# Patient Record
Sex: Male | Born: 1995 | Race: Black or African American | Hispanic: No | Marital: Single | Smoking: Never smoker
Health system: Southern US, Community
[De-identification: ages and names within clinical notes are randomized; demographics above are authoritative.]

---

## 2017-08-04 ENCOUNTER — Emergency Department (HOSPITAL_COMMUNITY): Payer: Self-pay

## 2017-08-04 ENCOUNTER — Emergency Department (HOSPITAL_COMMUNITY)
Admission: EM | Admit: 2017-08-04 | Discharge: 2017-08-04 | Disposition: A | Discharge status: Home / Self Care | Payer: Self-pay | Attending: Emergency Medicine | Admitting: Emergency Medicine

## 2017-08-04 ENCOUNTER — Other Ambulatory Visit: Payer: Self-pay

## 2017-08-04 ENCOUNTER — Encounter (HOSPITAL_COMMUNITY): Payer: Self-pay

## 2017-08-04 DIAGNOSIS — Y9389 Activity, other specified: Secondary | ICD-10-CM | POA: Insufficient documentation

## 2017-08-04 DIAGNOSIS — Y999 Unspecified external cause status: Secondary | ICD-10-CM | POA: Insufficient documentation

## 2017-08-04 DIAGNOSIS — S63501A Unspecified sprain of right wrist, initial encounter: Secondary | ICD-10-CM | POA: Insufficient documentation

## 2017-08-04 DIAGNOSIS — W2209XA Striking against other stationary object, initial encounter: Secondary | ICD-10-CM | POA: Insufficient documentation

## 2017-08-04 DIAGNOSIS — Y929 Unspecified place or not applicable: Secondary | ICD-10-CM | POA: Insufficient documentation

## 2017-08-04 NOTE — ED Provider Notes (Signed)
[facility] EMERGENCY DEPARTMENT Provider Note   CSN: [id number] Arrival date & time: 08/04/17  1709     History   Chief Complaint No chief complaint on file.   HPI Dillon Collier is a 22 y.o. male who presents to the ED with wrist pain. Patient reports punching something yesterday causing the pain. Patient denies any other problems at this time.    HPI  History reviewed. No pertinent past medical history.  There are no active problems to display for this patient.   History reviewed. No pertinent surgical history.     Home Medications    Prior to Admission medications   Not on File    Family History No family history on file.  Social History Social History   Tobacco Use  . Smoking status: Not on file  Substance Use Topics  . Alcohol use: Not on file  . Drug use: Not on file     Allergies   Patient has no known allergies.   Review of Systems Review of Systems  Musculoskeletal: Positive for arthralgias.       Right wrist pain.     Physical Exam Updated Vital Signs BP 118/75   Pulse 72   Temp 98.3 F (36.8 C) (Oral)   Resp 18   SpO2 99%   Physical Exam  Constitutional: He appears well-developed and well-nourished. No distress.  HENT:  Head: Normocephalic.  Eyes: EOM are normal.  Neck: Normal range of motion. Neck supple.  Cardiovascular: Normal rate.  Pulmonary/Chest: Effort normal.  Musculoskeletal:       Right wrist: He exhibits tenderness. He exhibits normal range of motion, no swelling, no crepitus and no deformity.       Right hand: He exhibits normal range of motion, no tenderness, normal capillary refill, no deformity and no swelling. Normal sensation noted. Normal strength noted. He exhibits no thumb/finger opposition.  Right wrist with discomfort when bending it forward. Radial pulses 2+, adequate circulation.   Neurological: He is alert.  Skin: Skin is warm and dry.  Psychiatric: He has a normal mood and  affect.  Nursing note and vitals reviewed.    ED Treatments / Results  Labs (all labs ordered are listed, but only abnormal results are displayed) Labs Reviewed - No data to display  Radiology Dg Wrist Complete Right  Result Date: 08/04/2017 CLINICAL DATA:  Painful right wrist.  Punched wall. EXAM: RIGHT WRIST - COMPLETE 3+ VIEW COMPARISON:  None. FINDINGS: There is no evidence of fracture or dislocation. There is no evidence of arthropathy or other focal bone abnormality. Soft tissues are unremarkable. IMPRESSION: No acute osseous abnormality. Electronically Signed   By: Tollie Eth M.D.   On: 08/04/2017 17:53    Procedures Procedures (including critical care time)  Medications Ordered in ED Medications - No data to display   Initial Impression / Assessment and Plan / ED Course  I have reviewed the triage vital signs and the nursing notes. SUBJECTIVE: Greyson Peavy Bjorklund is a 22 y.o. male who sustained a right wrist injury yesterday. Mechanism of injury: was from hitting a wall. Immediate symptoms: included pain and swelling. Symptoms have continued since that time. No prior problems with this area in the past.  OBJECTIVE: Vital signs as noted above. Appearance: alert, well appearing, and in no distress Wrist exam:tender to the palmar aspect of the right wrist. X-ray: negative for fracture or dislocation.  ASSESSMENT: Right wrist sprain PLAN: rest the injured area as much as  practical, apply ice packs, elevate the injured limb, splint dispensed and applied by RN Patient stable for d/c without focal neuro deficits. Return precautions discussed.    Final Clinical Impressions(s) / ED Diagnoses   Final diagnoses:  Right wrist sprain, initial encounter    ED Discharge Orders    None       Kerrie Buffaloeese, Hope ElsieM, [place]NP 08/04/17 Nolen [id number]    Shaune PollackIsaacs, Cameron, MD 08/05/17 [phone]

## 2017-08-04 NOTE — Progress Notes (Signed)
Orthopedic Tech Progress Note Patient Details:  Hiram ComberShymez Daquan Bamba 10/01/1995 [id number]  Ortho Devices Type of Ortho Device: Velcro wrist forearm splint Ortho Device/Splint Location: RUE Ortho Device/Splint Interventions: Ordered, Application   Post Interventions Patient Tolerated: Well Instructions Provided: Care of device   Jennye MoccasinHughes, Zen Cedillos Craig 08/04/2017, 8:15 PM

## 2017-08-04 NOTE — Discharge Instructions (Signed)
Take advil as needed for pain. Return as needed for worsening symptoms.

## 2017-08-04 NOTE — ED Notes (Signed)
Ortho tech paged  

## 2017-08-04 NOTE — ED Triage Notes (Signed)
Patient complains of right wrist pain with any ROM after punching something yesterday, no hand pain, no deformity

## 2017-08-09 ENCOUNTER — Emergency Department (HOSPITAL_COMMUNITY): Payer: Self-pay

## 2017-08-09 ENCOUNTER — Encounter (HOSPITAL_COMMUNITY): Payer: Self-pay

## 2017-08-09 ENCOUNTER — Emergency Department (HOSPITAL_COMMUNITY)
Admission: EM | Admit: 2017-08-09 | Discharge: 2017-08-09 | Disposition: A | Discharge status: Home / Self Care | Payer: Self-pay | Attending: Emergency Medicine | Admitting: Emergency Medicine

## 2017-08-09 ENCOUNTER — Other Ambulatory Visit: Payer: Self-pay

## 2017-08-09 DIAGNOSIS — W2203XA Walked into furniture, initial encounter: Secondary | ICD-10-CM | POA: Insufficient documentation

## 2017-08-09 DIAGNOSIS — S93509A Unspecified sprain of unspecified toe(s), initial encounter: Secondary | ICD-10-CM

## 2017-08-09 DIAGNOSIS — Y93E1 Activity, personal bathing and showering: Secondary | ICD-10-CM | POA: Insufficient documentation

## 2017-08-09 DIAGNOSIS — Y92018 Other place in single-family (private) house as the place of occurrence of the external cause: Secondary | ICD-10-CM | POA: Insufficient documentation

## 2017-08-09 DIAGNOSIS — S93504A Unspecified sprain of right lesser toe(s), initial encounter: Secondary | ICD-10-CM | POA: Insufficient documentation

## 2017-08-09 DIAGNOSIS — Y998 Other external cause status: Secondary | ICD-10-CM | POA: Insufficient documentation

## 2017-08-09 MED ORDER — IBUPROFEN 600 MG PO TABS: 600.0000 mg | ORAL_TABLET | Freq: Four times a day (QID) | ORAL | 0 refills | Status: AC | PRN

## 2017-08-09 MED ORDER — IBUPROFEN 400 MG PO TABS
600.0000 mg | ORAL_TABLET | Freq: Once | ORAL | Status: AC
  Administered 2017-08-09: 600 mg via ORAL
  Filled 2017-08-09: qty 1

## 2017-08-09 NOTE — ED Provider Notes (Signed)
Dillon Collier [facility] EMERGENCY DEPARTMENT Provider Note   CSN: [id number] Arrival date & time: 08/09/17  1430     History   Chief Complaint Chief Complaint  Patient presents with  . Toe Injury    HPI Dillon Collier is a 22 y.o. male.  HPI   Patient is a 22 year old male with no significant past medical history presenting for a right fourth toe injury.  Patient reports he got caught on a chair while getting out of the shower today and jammed it.  Patient reports he was unable to walk on it subsequently.  Patient reports that any flexion of the toe produces intense pain.  Patient reports some bruising to the medial aspect of the right fourth digit.  Patient denies numbness of the affected toe.  History reviewed. No pertinent past medical history.  There are no active problems to display for this patient.   History reviewed. No pertinent surgical history.     Home Medications    Prior to Admission medications   Medication Sig Start Date End Date Taking? Authorizing Provider  ibuprofen (ADVIL,MOTRIN) 600 MG tablet Take 1 tablet (600 mg total) by mouth every 6 (six) hours as needed. 08/09/17   Elisha Ponder, PA-C    Family History History reviewed. No pertinent family history.  Social History Social History   Tobacco Use  . Smoking status: Never Smoker  Substance Use Topics  . Alcohol use: No    Frequency: Never  . Drug use: No     Allergies   Patient has no known allergies.   Review of Systems Review of Systems  Musculoskeletal: Positive for arthralgias and joint swelling.  Skin: Positive for color change. Negative for wound.  Neurological: Negative for weakness and numbness.     Physical Exam Updated Vital Signs BP 130/84 (BP Location: Right Arm)   Pulse 60   Temp 98.2 F (36.8 C) (Oral)   Resp 16   SpO2 100%   Physical Exam  Constitutional: He appears well-developed and well-nourished. No distress.  Sitting comfortably in bed.   HENT:  Head: Normocephalic and atraumatic.  Eyes: Conjunctivae are normal. Right eye exhibits no discharge. Left eye exhibits no discharge.  EOMs normal to gross examination.  Neck: Normal range of motion.  Cardiovascular: Normal rate and regular rhythm.  Intact, 2+ DP pulse of right lower extremity.  Pulmonary/Chest:  Normal respiratory effort. Patient converses comfortably. No audible wheeze or stridor.  Abdominal: He exhibits no distension.  Musculoskeletal: Normal range of motion.  There is mild amount of ecchymosis to the medial aspect of the right fourth toe.  Minimal swelling.  Patient exhibits pain with passive range of motion of this toe.  Patient has decreased range of motion of flexion and extension of toes due to pain.  Full sensation to light touch intact to distal tip of all toes.  Compartments soft of the right lower extremity, and capillary refill less than 2 seconds.  Neurological: He is alert.  Cranial nerves intact to gross observation. Patient moves extremities without difficulty.  Skin: Skin is warm and dry. He is not diaphoretic.  Psychiatric: He has a normal mood and affect. His behavior is normal. Judgment and thought content normal.  Nursing note and vitals reviewed.    ED Treatments / Results  Labs (all labs ordered are listed, but only abnormal results are displayed) Labs Reviewed - No data to display  EKG  EKG Interpretation None       Radiology  Dg Foot Complete Right  Result Date: 08/09/2017 CLINICAL DATA:  Pain after hitting foot against chair EXAM: RIGHT FOOT COMPLETE - 3+ VIEW COMPARISON:  None. FINDINGS: Frontal, oblique, and lateral views were obtained. There is no fracture or dislocation. Joint spaces appear normal. No erosive change. IMPRESSION: No fracture or dislocation.  No evident arthropathy. Electronically Signed   By: Bretta BangWilliam  Woodruff III M.D.   On: 08/09/2017 15:03    Procedures Procedures (including critical care  time)  Medications Ordered in ED Medications  ibuprofen (ADVIL,MOTRIN) tablet 600 mg (600 mg Oral Given 08/09/17 1637)     Initial Impression / Assessment and Plan / ED Course  I have reviewed the triage vital signs and the nursing notes.  Pertinent labs & imaging results that were available during my care of the patient were reviewed by me and considered in my medical decision making (see chart for details).     Patient is well-appearing and in no acute distress.  Pain adequately controlled with ibuprofen.  X-ray, reviewed by me, is negative for any occult fracture.  Suspect sprain of the toe.  Will buddy tape toes, and instruct patient to perform range of motion of these as tolerated.  I instructed patient that he should minimize buddy taping in order to rehabilitate the toe.  Return precautions were given for any numbness, pallor, or increased swelling.  Patient is in understanding and agrees with the plan of care.  Final Clinical Impressions(s) / ED Diagnoses   Final diagnoses:  Sprain of toe, initial encounter    ED Discharge Orders        Ordered    ibuprofen (ADVIL,MOTRIN) 600 MG tablet  Every 6 hours PRN     08/09/17 1611       Elisha PonderMurray, Vidur Knust B, PA-C 08/09/17 1736    Arby BarrettePfeiffer, Marcy, MD 08/11/17 [phone]

## 2017-08-09 NOTE — ED Notes (Signed)
Patient transported to X-ray 

## 2017-08-09 NOTE — Discharge Instructions (Signed)
Please see the information and instructions below regarding your visit.  Your diagnoses today include:  1. Sprain of toe, initial encounter     Tests performed today include: See side panel of your discharge paperwork for testing performed today. Vital signs are listed at the bottom of these instructions.   X-rays show no fracture.  Medications prescribed:    Take any prescribed medications only as prescribed, and any over the counter medications only as directed on the packaging.  Home care instructions:  Please follow any educational materials contained in this packet.   Follow-up instructions: Please follow-up with your primary care provider as soon as possible for further evaluation of your symptoms if they are not completely improved.   Please follow up with orthopedics as needed for ongoing pain.  Return instructions:  Please return to the Emergency Department if you experience worsening symptoms.  Please return the emergency department for any change in color of the toe where the toe turns white, increasing swelling, loss of sensation. Please return if you have any other emergent concerns.  Additional Information:   Your vital signs today were: BP 130/84 (BP Location: Right Arm)    Pulse 60    Temp 98.2 F (36.8 C) (Oral)    Resp 16    SpO2 100%  If your blood pressure (BP) was elevated on multiple readings during this visit above 130 for the top number or above 80 for the bottom number, please have this repeated by your primary care provider within one month. --------------  Thank you for allowing us to participate in your care today.

## 2017-08-09 NOTE — ED Triage Notes (Signed)
Pt endorses hitting his right foot on a chair this afternoon and now having 4th toe pain. CMS intact. Limited ROM.

## 2017-09-29 ENCOUNTER — Emergency Department (HOSPITAL_COMMUNITY)
Admission: EM | Admit: 2017-09-29 | Discharge: 2017-09-29 | Disposition: A | Discharge status: Home / Self Care | Payer: Self-pay | Attending: Emergency Medicine | Admitting: Emergency Medicine

## 2017-09-29 ENCOUNTER — Encounter (HOSPITAL_COMMUNITY): Payer: Self-pay | Admitting: Emergency Medicine

## 2017-09-29 DIAGNOSIS — R21 Rash and other nonspecific skin eruption: Secondary | ICD-10-CM | POA: Insufficient documentation

## 2017-09-29 DIAGNOSIS — L309 Dermatitis, unspecified: Secondary | ICD-10-CM | POA: Insufficient documentation

## 2017-09-29 MED ORDER — METHYLPREDNISOLONE SODIUM SUCC 125 MG IJ SOLR
125.0000 mg | Freq: Once | INTRAMUSCULAR | Status: AC
Start: 2017-09-29 — End: 2017-09-29
  Administered 2017-09-29: 125 mg via INTRAMUSCULAR
  Filled 2017-09-29: qty 2

## 2017-09-29 MED ORDER — CLOTRIMAZOLE 1 % EX CREA: TOPICAL_CREAM | CUTANEOUS | 0 refills | Status: AC

## 2017-09-29 NOTE — ED Provider Notes (Signed)
[facility] EMERGENCY DEPARTMENT Provider Note   CSN: [id number] Arrival date & time: 09/29/17  1537     History   Chief Complaint Chief Complaint  Patient presents with  . Rash    HPI Dillon Collier is a 22 y.o. male.  The history is provided by the patient. No language interpreter was used.  Rash   This is a new problem. The current episode started more than 1 week ago. The problem has been gradually worsening. The problem is associated with nothing. There has been no fever. The pain is moderate. The pain has been constant since onset. He has tried nothing for the symptoms. The treatment provided no relief.  Pt reports he has eczema.  Pt reports he is itchy all over.  Pt reports he has had steroid injections in the past that have helped.  Pt complains of an area of irritation at the base of the shaft of his penis.  Pt reports no std risk, no ulcers, no pustules.    History reviewed. No pertinent past medical history.  There are no active problems to display for this patient.   No past surgical history on file.      Home Medications    Prior to Admission medications   Medication Sig Start Date End Date Taking? Authorizing Provider  ibuprofen (ADVIL,MOTRIN) 600 MG tablet Take 1 tablet (600 mg total) by mouth every 6 (six) hours as needed. 08/09/17   Elisha Ponder, PA-C    Family History No family history on file.  Social History Social History   Tobacco Use  . Smoking status: Never Smoker  Substance Use Topics  . Alcohol use: No    Frequency: Never  . Drug use: No     Allergies   Patient has no known allergies.   Review of Systems Review of Systems  Skin: Positive for rash.  All other systems reviewed and are negative.    Physical Exam Updated Vital Signs BP 134/88 (BP Location: Right Arm)   Pulse 74   Temp 98.1 F (36.7 C) (Oral)   Resp 20   Ht 5\' 7"  (1.702 m)   Wt 59 kg (130 lb)   SpO2 98%   BMI 20.36 kg/m    Physical Exam  Constitutional: He appears well-developed and well-nourished.  HENT:  Head: Normocephalic and atraumatic.  Eyes: Conjunctivae are normal.  Neck: Neck supple.  Cardiovascular: Normal rate and regular rhythm.  No murmur heard. Pulmonary/Chest: Effort normal and breath sounds normal. No respiratory distress.  Abdominal: Soft. There is no tenderness.  Musculoskeletal: He exhibits no edema.  Dry irritated skin,   Penis erythematous dried skin looking area base of penis,  Rash does not look herpetic.   Neurological: He is alert.  Skin: Skin is warm and dry.  Psychiatric: He has a normal mood and affect.  Nursing note and vitals reviewed.    ED Treatments / Results  Labs (all labs ordered are listed, but only abnormal results are displayed) Labs Reviewed - No data to display  EKG None  Radiology No results found.  Procedures Procedures (including critical care time)  Medications Ordered in ED Medications - No data to display   Initial Impression / Assessment and Plan / ED Course  I have reviewed the triage vital signs and the nursing notes.  Pertinent labs & imaging results that were available during my care of the patient were reviewed by me and considered in my medical decision making (see  chart for details).    MDM Pt given solumedrol IM.  I will also have pt use lotrimin.  Pt possibly has jock itch,  Rash may alos all be eczema.   Final Clinical Impressions(s) / ED Diagnoses   Final diagnoses:  Rash  Eczema, unspecified type  Groin rash    ED Discharge Orders    None    An After Visit Summary was printed and given to the patient.    Elson AreasSofia, Leslie K, [place] JerseyPA-C 09/29/17 1818    Wynetta FinesMessick, Peter C, MD 09/29/17 [phone]

## 2017-09-29 NOTE — ED Triage Notes (Signed)
Pt states 1 week of rash to neck, legs and groin. Pt thinks he was using a bad soap. Pt also has googled his symptoms and thinks he has psoriasis.

## 2018-11-22 IMAGING — DX DG FOOT COMPLETE 3+V*R*
3 series · 3 of 3 positions shown · non-contrast
Comparison: None.

CLINICAL DATA: Pain after hitting foot against chair

EXAM:
RIGHT FOOT COMPLETE - 3+ VIEW

[x foot ap right]
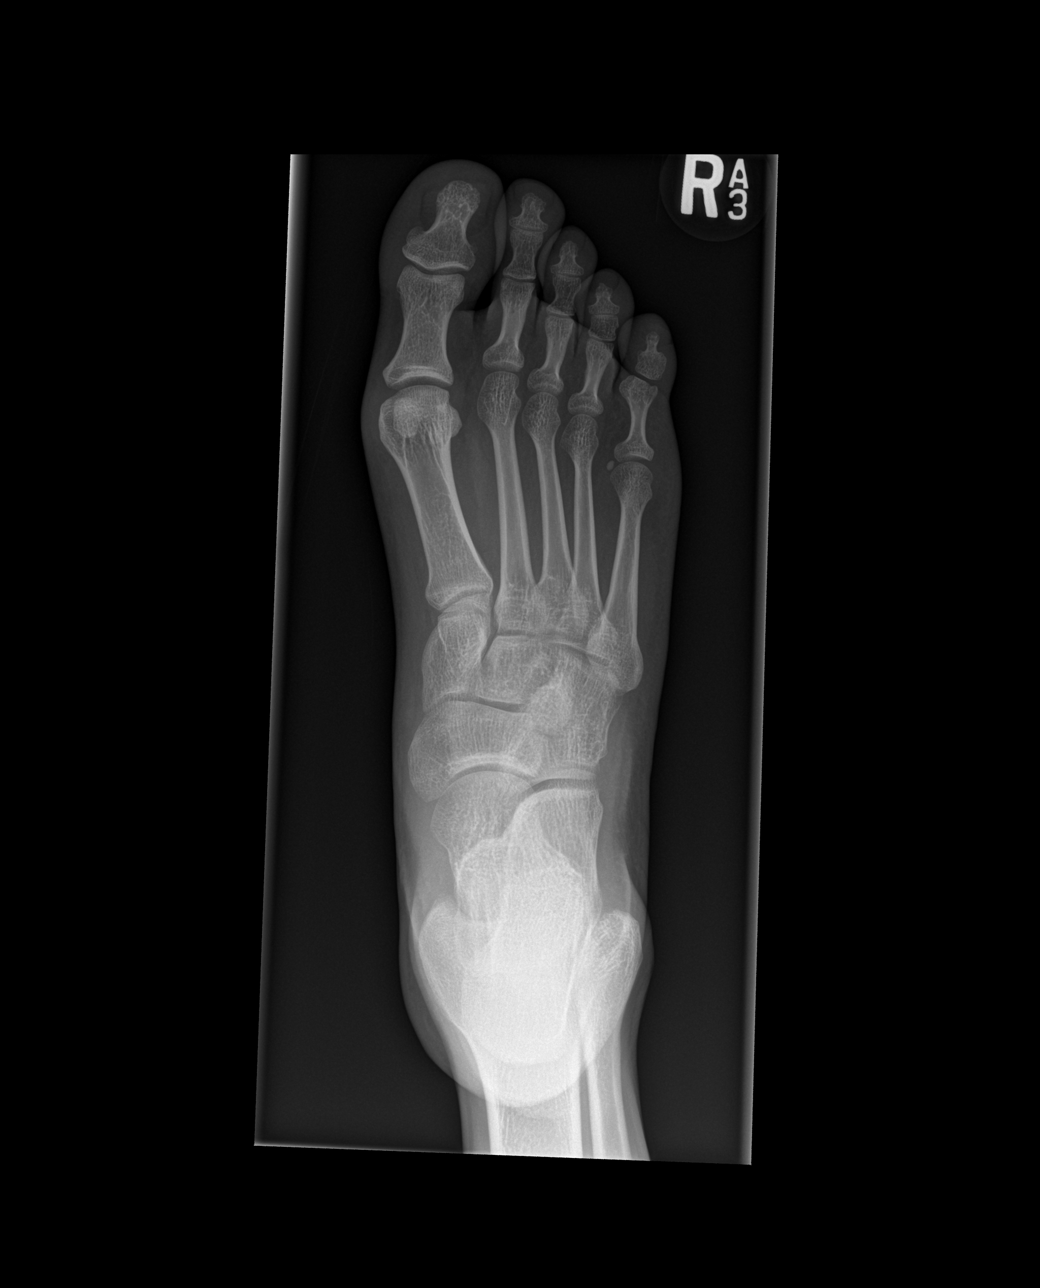

[x foot obl right]
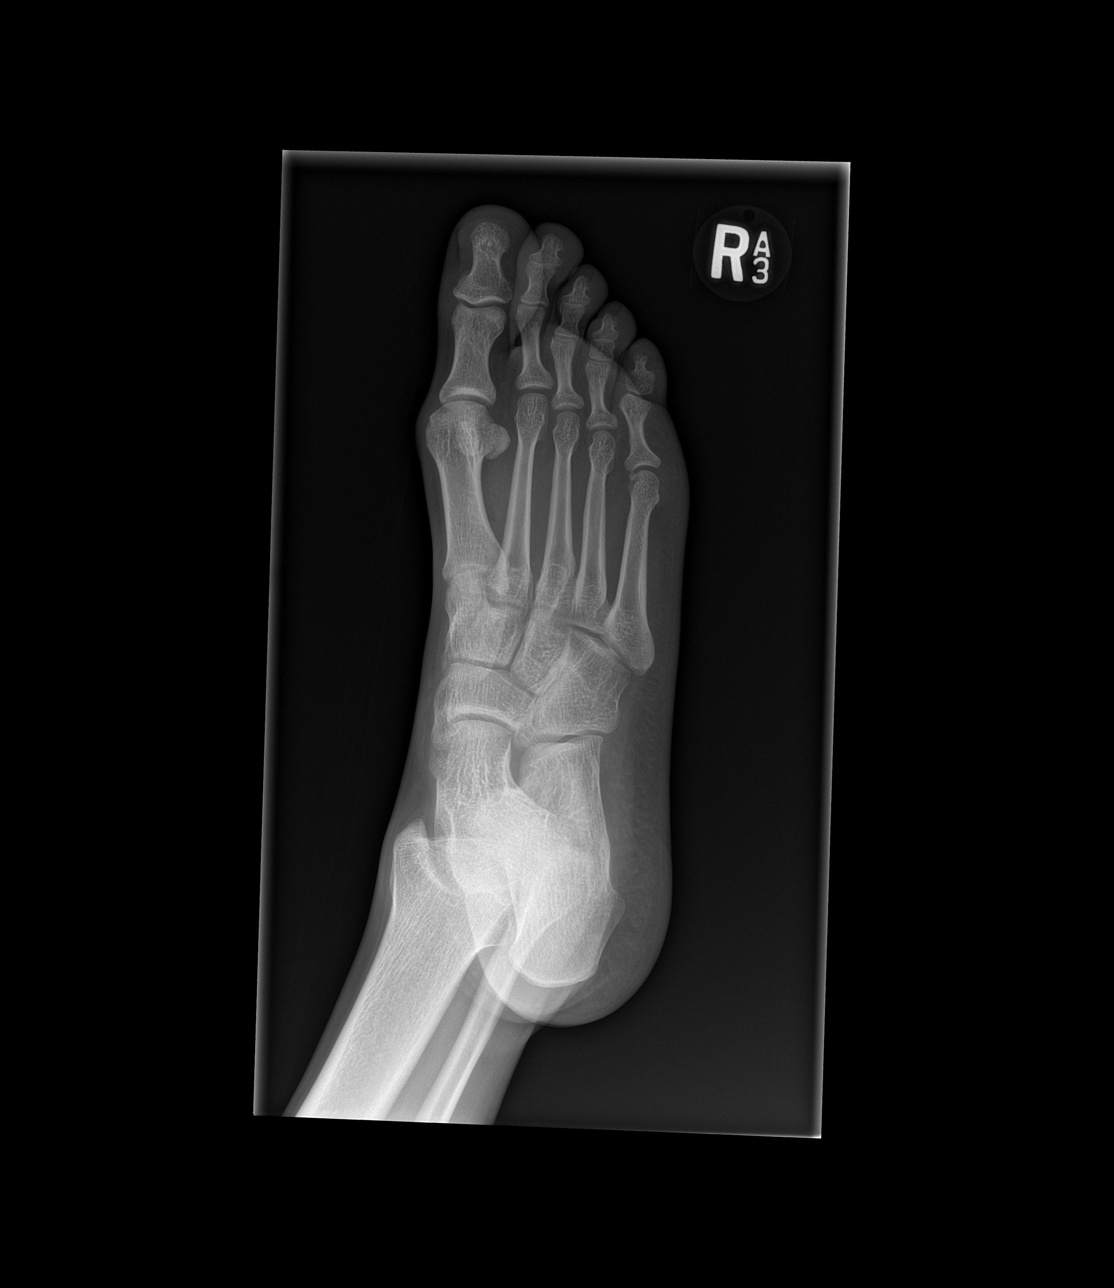

[x foot lat right]
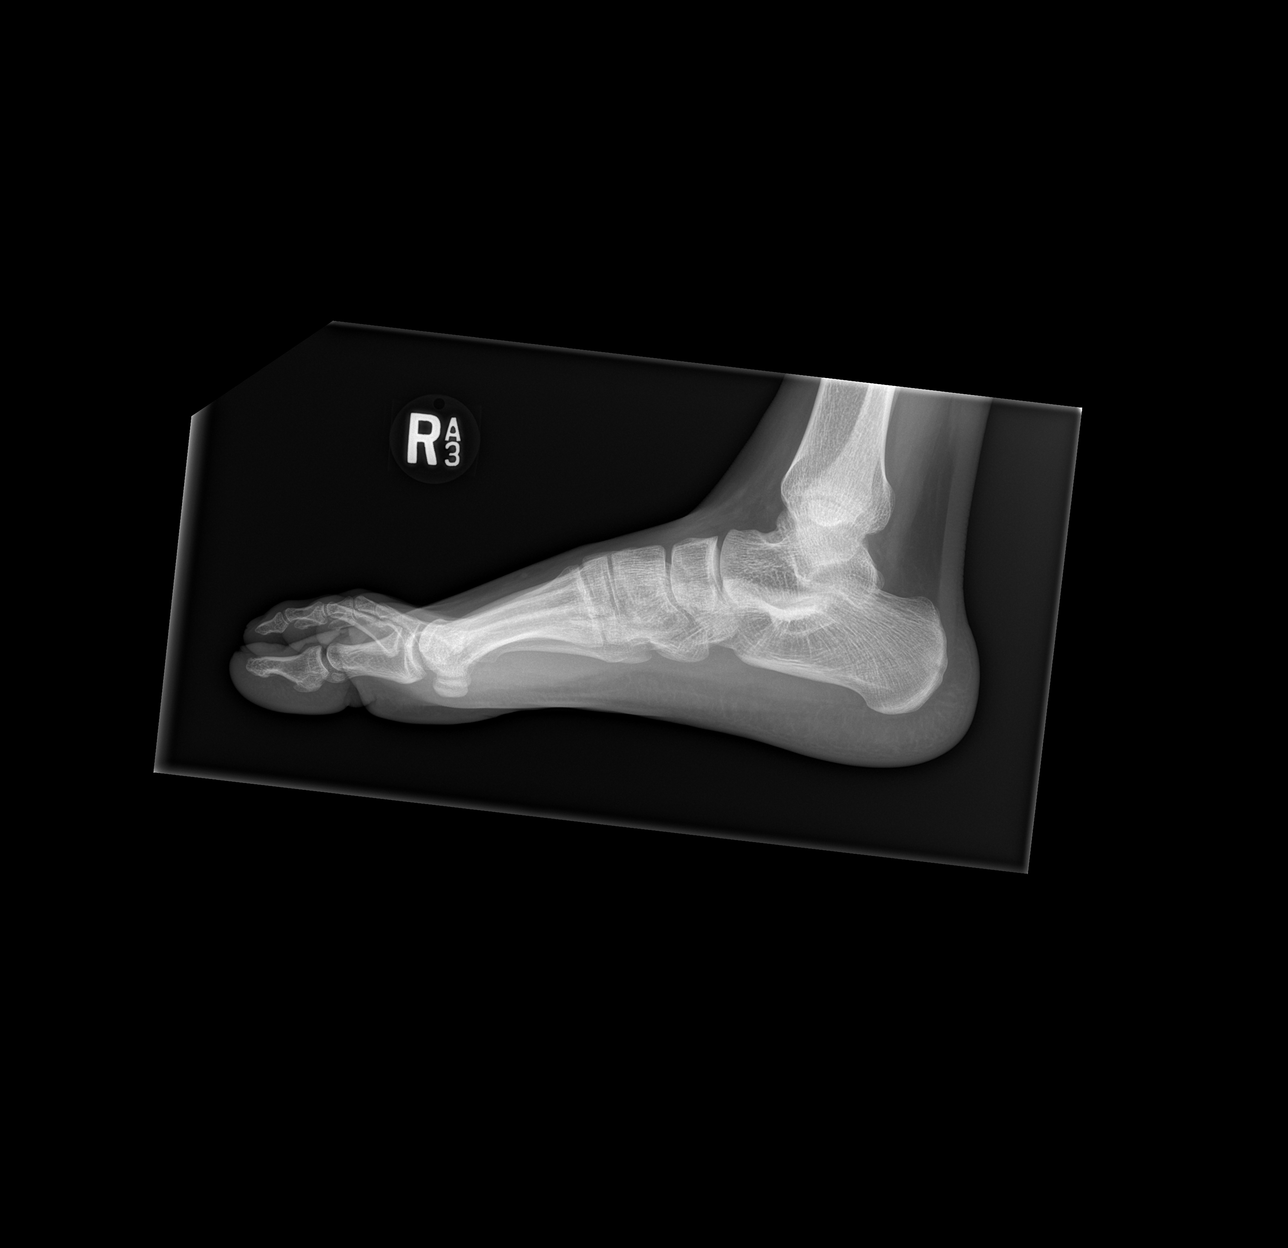

[3 of 3 positions shown; findings below may reference images not displayed]

FINDINGS: Frontal, oblique, and lateral views were obtained. There is no
fracture or dislocation. Joint spaces appear normal. No erosive
change.
IMPRESSION: No fracture or dislocation.  No evident arthropathy.
